# Patient Record
Sex: Female | Born: 1935 | Race: White | Hispanic: No | Smoking: Never smoker
Health system: Southern US, Community
[De-identification: ages and names within clinical notes are randomized; demographics above are authoritative.]

## PROBLEM LIST (undated history)

## (undated) DIAGNOSIS — F32A Depression, unspecified: Secondary | ICD-10-CM

## (undated) DIAGNOSIS — J45909 Unspecified asthma, uncomplicated: Secondary | ICD-10-CM

## (undated) DIAGNOSIS — C801 Malignant (primary) neoplasm, unspecified: Secondary | ICD-10-CM

## (undated) DIAGNOSIS — I10 Essential (primary) hypertension: Secondary | ICD-10-CM

## (undated) DIAGNOSIS — B019 Varicella without complication: Secondary | ICD-10-CM

## (undated) DIAGNOSIS — N390 Urinary tract infection, site not specified: Secondary | ICD-10-CM

## (undated) DIAGNOSIS — G459 Transient cerebral ischemic attack, unspecified: Secondary | ICD-10-CM

## (undated) DIAGNOSIS — F329 Major depressive disorder, single episode, unspecified: Secondary | ICD-10-CM

## (undated) DIAGNOSIS — I4891 Unspecified atrial fibrillation: Secondary | ICD-10-CM

## (undated) DIAGNOSIS — I639 Cerebral infarction, unspecified: Secondary | ICD-10-CM

## (undated) DIAGNOSIS — E039 Hypothyroidism, unspecified: Secondary | ICD-10-CM

## (undated) DIAGNOSIS — F0390 Unspecified dementia without behavioral disturbance: Secondary | ICD-10-CM

## (undated) DIAGNOSIS — I495 Sick sinus syndrome: Secondary | ICD-10-CM

## (undated) DIAGNOSIS — R32 Unspecified urinary incontinence: Secondary | ICD-10-CM

## (undated) HISTORY — DX: Urinary tract infection, site not specified: N39.0

## (undated) HISTORY — DX: Unspecified dementia, unspecified severity, without behavioral disturbance, psychotic disturbance, mood disturbance, and anxiety: F03.90

## (undated) HISTORY — PX: INSERT / REPLACE / REMOVE PACEMAKER: SUR710

## (undated) HISTORY — DX: Malignant (primary) neoplasm, unspecified: C80.1

## (undated) HISTORY — PX: PACEMAKER PLACEMENT: SHX43

## (undated) HISTORY — PX: COLOSTOMY: SHX63

## (undated) HISTORY — DX: Essential (primary) hypertension: I10

## (undated) HISTORY — DX: Unspecified asthma, uncomplicated: J45.909

## (undated) HISTORY — DX: Varicella without complication: B01.9

## (undated) HISTORY — DX: Hypothyroidism, unspecified: E03.9

## (undated) HISTORY — DX: Major depressive disorder, single episode, unspecified: F32.9

## (undated) HISTORY — PX: HIP FRACTURE SURGERY: SHX118

## (undated) HISTORY — PX: TOTAL HIP ARTHROPLASTY: SHX124

## (undated) HISTORY — DX: Depression, unspecified: F32.A

## (undated) HISTORY — PX: TOTAL SHOULDER REPLACEMENT: SUR1217

## (undated) HISTORY — DX: Unspecified atrial fibrillation: I48.91

## (undated) HISTORY — DX: Unspecified urinary incontinence: R32

## (undated) HISTORY — DX: Sick sinus syndrome: I49.5

## (undated) HISTORY — PX: ABDOMINAL HYSTERECTOMY: SHX81

## (undated) HISTORY — PX: WRIST SURGERY: SHX841

## (undated) HISTORY — DX: Transient cerebral ischemic attack, unspecified: G45.9

## (undated) HISTORY — DX: Cerebral infarction, unspecified: I63.9

## (undated) HISTORY — PX: THYROIDECTOMY: SHX17

---

## 2014-01-20 LAB — HEPATIC FUNCTION PANEL
ALK PHOS: 153 U/L — AB (ref 25–125)
ALT: 12 U/L (ref 7–35)
AST: 14 U/L (ref 13–35)
Bilirubin, Total: 0.5 mg/dL

## 2014-01-20 LAB — CBC AND DIFFERENTIAL
HEMATOCRIT: 33 % — AB (ref 36–46)
Hemoglobin: 10.7 g/dL — AB (ref 12.0–16.0)
PLATELETS: 231 10*3/uL (ref 150–399)
WBC: 6.7 10^3/mL

## 2014-01-20 LAB — BASIC METABOLIC PANEL
BUN: 19 mg/dL (ref 4–21)
CREATININE: 1.2 mg/dL — AB (ref 0.5–1.1)
Glucose: 114 mg/dL
Potassium: 3.6 mmol/L (ref 3.4–5.3)
Sodium: 141 mmol/L (ref 137–147)

## 2014-01-20 LAB — POCT INR: INR: 1 (ref 0.9–1.1)

## 2016-03-13 ENCOUNTER — Encounter: Payer: Self-pay | Admitting: Family Medicine

## 2016-03-13 ENCOUNTER — Ambulatory Visit (INDEPENDENT_AMBULATORY_CARE_PROVIDER_SITE_OTHER): Payer: Medicare Other | Admitting: Family Medicine

## 2016-03-13 VITALS — BP 122/60 | HR 80 | Temp 97.7°F | Wt 203.2 lb

## 2016-03-13 DIAGNOSIS — R296 Repeated falls: Secondary | ICD-10-CM | POA: Diagnosis not present

## 2016-03-13 DIAGNOSIS — L89152 Pressure ulcer of sacral region, stage 2: Secondary | ICD-10-CM

## 2016-03-13 DIAGNOSIS — F329 Major depressive disorder, single episode, unspecified: Secondary | ICD-10-CM

## 2016-03-13 DIAGNOSIS — F919 Conduct disorder, unspecified: Secondary | ICD-10-CM

## 2016-03-13 DIAGNOSIS — F32A Depression, unspecified: Secondary | ICD-10-CM

## 2016-03-13 DIAGNOSIS — I495 Sick sinus syndrome: Secondary | ICD-10-CM

## 2016-03-13 NOTE — Patient Instructions (Signed)
Take the meds as we discussed.  You may increase the Risperdal to 1 mg if you like.  We will arrange for home health.  Follow up in 3 months.  Take care  Dr. Lacinda Axon

## 2016-03-13 NOTE — Progress Notes (Signed)
Pre visit review using our clinic review tool, if applicable. No additional management support is needed unless otherwise documented below in the visit note. 

## 2016-03-14 ENCOUNTER — Encounter: Payer: Self-pay | Admitting: Family Medicine

## 2016-03-14 DIAGNOSIS — F329 Major depressive disorder, single episode, unspecified: Secondary | ICD-10-CM | POA: Insufficient documentation

## 2016-03-14 DIAGNOSIS — R296 Repeated falls: Secondary | ICD-10-CM | POA: Insufficient documentation

## 2016-03-14 DIAGNOSIS — L89159 Pressure ulcer of sacral region, unspecified stage: Secondary | ICD-10-CM | POA: Insufficient documentation

## 2016-03-14 DIAGNOSIS — F32A Depression, unspecified: Secondary | ICD-10-CM | POA: Insufficient documentation

## 2016-03-14 DIAGNOSIS — I495 Sick sinus syndrome: Secondary | ICD-10-CM | POA: Insufficient documentation

## 2016-03-14 DIAGNOSIS — F919 Conduct disorder, unspecified: Secondary | ICD-10-CM | POA: Insufficient documentation

## 2016-03-14 LAB — BASIC METABOLIC PANEL
BUN: 21 mg/dL (ref 6–23)
CHLORIDE: 105 meq/L (ref 96–112)
CO2: 29 mEq/L (ref 19–32)
Calcium: 9.1 mg/dL (ref 8.4–10.5)
Creatinine, Ser: 0.92 mg/dL (ref 0.40–1.20)
GFR: 61.35 mL/min (ref >60.00)
Glucose, Bld: 104 mg/dL — ABNORMAL HIGH (ref 70–99)
POTASSIUM: 4.2 meq/L (ref 3.5–5.1)
SODIUM: 142 meq/L (ref 135–145)

## 2016-03-14 NOTE — Progress Notes (Signed)
Subjective:  Patient ID: Carly Clay, female    DOB: May 06, 1927  Age: 82 y.o. MRN: [id number]  CC: Establish care  HPI Carly Clay is a 82 y.o. female presents to the clinic today to establish care. Current issues are below.  Sacral decubitus  Patient now lives with her daughter, Carly Clay.  Daughter is an Therapist, sports and has been tending to the wound.  She states it is shallow (depth - tip of Q-tip).  No drainage.   No erythema.  Daughter tries to offset pressure on the wound and have her void/changed regularly.  Frequent falls  Patient has history of frequent recurrent falls.  Secondary to weakness/deconditioning and multiple orthopedic issues.  Daughter is currently taking care of her and is doing okay with transfers.  She recently left assisted-living, and daughter would like to arrange for home health at home.  Sick sinus syndrome  S/p pacemaker placement.  Needs cardiologist for regular interrogation/check of device.  Nightmares  For the past several nights patient has been having vivid dreams and has been attempting to get out of bed at night.  She is currently on Risperdal. Daughter is unsure why, but we both think this is secondary to behavior issues while she was in the assisted-living facility.  Daughter would like to discuss if there is anything that can be done about this (med changes).   PMH, Surgical Hx, Family Hx, Social History reviewed and updated as below.  Past Medical History  Diagnosis Date  . Asthma   . Chicken pox   . Depression   . Cancer [facility])     Thyroid cancer, Cervical cancer  . Hypothyroidism   . Stroke [facility])     Following Thyroidectomy  . Urinary incontinence   . UTI (urinary tract infection)   . TIA (transient ischemic attack)   . Sick sinus syndrome [facility])    Past Surgical History  Procedure Laterality Date  . Total hip arthroplasty Left   . Hip fracture surgery Right   . Abdominal hysterectomy    . Wrist  surgery Left   . Pacemaker placement    . Colostomy      following Perforated bowel.  . Thyroidectomy    . Total shoulder replacement Left    Family History  Problem Relation Age of Onset  . Stroke Mother   . Hypertension Mother   . Stomach cancer Father    Social History  Substance Use Topics  . Smoking status: Never Smoker   . Smokeless tobacco: Never Used  . Alcohol Use: No    Review of Systems  Genitourinary:       Urinary incontinence.   Musculoskeletal: Positive for arthralgias.       Frequent falls.  Skin: Positive for wound.  All other systems reviewed and are negative.  Objective:   Today's Vitals: BP 122/60 mmHg  Pulse 80  Temp(Src) 97.7 F (36.5 C) (Oral)  Ht   Wt 203 lb 4 oz (92.194 kg)  SpO2 95%  Physical Exam  Constitutional:  Chronically ill appearing female, sitting in wheelchair, NAD.  HENT:  Head: Normocephalic and atraumatic.  Eyes: Conjunctivae are normal. No scleral icterus.  Neck: Neck supple.  Cardiovascular: Normal rate and regular rhythm.   Pulmonary/Chest: Effort normal. She has no wheezes. She has no rales.  Abdominal: Soft. She exhibits no distension. There is no tenderness. There is no rebound and no guarding.  Colostomy in place. No surrounding erythema.   Musculoskeletal:  Decreased ROM  of the right shoulder.   Neurological: She is alert.  Skin:  ~ 2 cm stage 2 sacral decubitus ulcer. No drainage.   Psychiatric:  Flat affect.    Assessment & Plan:   Problem List Items Addressed This Visit    Sacral decubitus ulcer - Primary    New problem. No evidence of infection. Arranging for home health RN to tend to and dress wound.      Relevant Orders   Ambulatory referral to [place] Metabolic Panel (BMET) (Completed)   Frequent falls    Arranging for home health PT, aide.      Sick sinus syndrome [facility])    Referring to cardiology for regular device checks.      Relevant Medications   furosemide (LASIX) 20  MG tablet   aspirin EC 81 MG tablet   Behavior disturbance    Will continue Risperdal. Advised that daughter can increase to 1 mg.      Depression    Continue Zoloft. Stopped Remeron today.      Relevant Medications   sertraline (ZOLOFT) 50 MG tablet   LORazepam (ATIVAN) 0.5 MG tablet      Outpatient Encounter Prescriptions as of 03/13/2016  Medication Sig  . aspirin EC 81 MG tablet Take 81 mg by mouth daily.  . Cholecalciferol (D3 ADULT PO) Take 2,000 Units by mouth.  . Cranberry-Vitamin C-Vitamin E (CRANBERRY PLUS VITAMIN C) 4200-20-3 MG-MG-UNIT CAPS Take 4,200 mg by mouth daily.  Mariane Baumgarten Sodium (COLACE PO) Take by mouth.  . furosemide (LASIX) 20 MG tablet Take 20 mg by mouth daily.  Marland Kitchen levothyroxine (SYNTHROID, LEVOTHROID) 150 MCG tablet Take 150 mcg by mouth daily before breakfast.  . LORazepam (ATIVAN) 0.5 MG tablet Take 0.5 mg by mouth as needed for anxiety.  . Multiple Vitamin (MULTI VITAMIN PO) Take by mouth.  . nitrofurantoin (MACRODANTIN) 50 MG capsule Take 50 mg by mouth at bedtime.  . risperiDONE (RISPERDAL) 0.5 MG tablet Take 0.5 mg by mouth at bedtime.  . sertraline (ZOLOFT) 50 MG tablet Take 50 mg by mouth daily.  . [DISCONTINUED] mirtazapine (REMERON) 15 MG tablet Take 15 mg by mouth at bedtime. Take one-half tablets at bedtime  . [DISCONTINUED] Specialty Vitamins Products (BIOTIN PLUS KERATIN) 10000-100 MCG-MG TABS Take 10,000 mcg by mouth daily.  . [DISCONTINUED] zinc sulfate 220 (50 Zn) MG capsule Take 220 mg by mouth daily.   No facility-administered encounter medications on file as of 03/13/2016.    Follow-up: 3 months.   [place]

## 2016-03-14 NOTE — Assessment & Plan Note (Signed)
Continue Zoloft. Stopped Remeron today.

## 2016-03-14 NOTE — Assessment & Plan Note (Addendum)
Arranging for home health PT, aide.

## 2016-03-14 NOTE — Assessment & Plan Note (Signed)
Will continue Risperdal. Advised that daughter can increase to 1 mg.

## 2016-03-14 NOTE — Assessment & Plan Note (Signed)
Referring to cardiology for regular device checks.

## 2016-03-14 NOTE — Assessment & Plan Note (Signed)
New problem. No evidence of infection. Arranging for home health RN to tend to and dress wound.

## 2016-03-16 ENCOUNTER — Telehealth: Payer: Self-pay | Admitting: Family Medicine

## 2016-03-16 NOTE — Telephone Encounter (Signed)
Call and see if a verbal can be given.

## 2016-03-16 NOTE — Telephone Encounter (Signed)
Faxed order to [facility].

## 2016-03-16 NOTE — Telephone Encounter (Signed)
Unable to reach sharon. Left message for a phone call back.

## 2016-03-16 NOTE — Telephone Encounter (Signed)
Can we not give a verbal.

## 2016-03-16 NOTE — Telephone Encounter (Signed)
Ivin Booty called from [facility] . She is needing an order for wound care to the patient stage 2 decubitus ulcer. She is expecting to see the patient this Friday you may call her on her cell phone or fax the order to [phone].

## 2016-03-16 NOTE — Telephone Encounter (Signed)
Please place orders

## 2016-03-16 NOTE — Telephone Encounter (Signed)
FYI

## 2016-03-29 ENCOUNTER — Encounter: Payer: Self-pay | Admitting: Family

## 2016-03-29 ENCOUNTER — Ambulatory Visit
Admission: RE | Admit: 2016-03-29 | Discharge: 2016-03-29 | Disposition: A | Discharge status: Home / Self Care | Payer: Medicare Other | Source: Ambulatory Visit | Attending: Family | Admitting: Family

## 2016-03-29 ENCOUNTER — Other Ambulatory Visit: Payer: Self-pay | Admitting: Family

## 2016-03-29 ENCOUNTER — Ambulatory Visit: Payer: Medicare Other | Admitting: Family Medicine

## 2016-03-29 ENCOUNTER — Ambulatory Visit (INDEPENDENT_AMBULATORY_CARE_PROVIDER_SITE_OTHER): Payer: Medicare Other | Admitting: Family

## 2016-03-29 VITALS — BP 122/70 | HR 80 | Temp 98.6°F | Resp 17 | Wt 154.0 lb

## 2016-03-29 DIAGNOSIS — R05 Cough: Secondary | ICD-10-CM | POA: Diagnosis present

## 2016-03-29 DIAGNOSIS — H109 Unspecified conjunctivitis: Secondary | ICD-10-CM | POA: Diagnosis not present

## 2016-03-29 DIAGNOSIS — I517 Cardiomegaly: Secondary | ICD-10-CM | POA: Insufficient documentation

## 2016-03-29 DIAGNOSIS — J690 Pneumonitis due to inhalation of food and vomit: Secondary | ICD-10-CM

## 2016-03-29 DIAGNOSIS — K449 Diaphragmatic hernia without obstruction or gangrene: Secondary | ICD-10-CM | POA: Insufficient documentation

## 2016-03-29 DIAGNOSIS — I700 Atherosclerosis of aorta: Secondary | ICD-10-CM | POA: Diagnosis not present

## 2016-03-29 DIAGNOSIS — Z950 Presence of cardiac pacemaker: Secondary | ICD-10-CM | POA: Insufficient documentation

## 2016-03-29 DIAGNOSIS — R059 Cough, unspecified: Secondary | ICD-10-CM

## 2016-03-29 MED ORDER — BENZONATATE 100 MG PO CAPS: 100.0000 mg | ORAL_CAPSULE | Freq: Three times a day (TID) | ORAL | Status: DC | PRN

## 2016-03-29 MED ORDER — ALBUTEROL SULFATE HFA 108 (90 BASE) MCG/ACT IN AERS: 2.0000 | INHALATION_SPRAY | Freq: Four times a day (QID) | RESPIRATORY_TRACT | Status: AC | PRN

## 2016-03-29 NOTE — Patient Instructions (Signed)
CXR   Use albuterol every 6 hours for first 24 hours to get good medication into the lungs and loosen congestion; after, you may use as needed and eventually stop all together when cough resolves.  Increase intake of clear fluids. Congestion is best treated by hydration, when mucus is wetter, it is thinner, less sticky, and easier to expel from the body, either through coughing up drainage, or by blowing your nose.   Get plenty of rest.   Use saline nasal drops and blow your nose frequently. Run a humidifier at night and elevate the head of the bed. Vicks Vapor rub will help with congestion and cough. Steam showers and sinus massage for congestion.   Use Acetaminophen or Ibuprofen as needed for fever or pain. Avoid second hand smoke. Even the smallest exposure will worsen symptoms.   Over the counter medications you can try include Delsym for cough, a decongestant for congestion, and Mucinex or Robitussin as an expectorant. Be sure to just get the plain Mucinex or Robitussin that just has one medication (Guaifenesen). We don't recommend the combination products. Note, be sure to drink two glasses of water with each dose of Mucinex as the medication will not work well without adequate hydration.   You can also try a teaspoon of honey to see if this will help reduce cough. Throat lozenges can sometimes be beneficial as well.    This illness will typically last 7 - 10 days.   Please follow up with our clinic if you develop a fever greater than 101 F, symptoms worsen, or do not resolve in the next week.

## 2016-03-29 NOTE — Telephone Encounter (Signed)
I left message with patient to call back at the [place] office for results of imaging and discussion of antibiotic. I also told her a new medication was being sent to her pharmacy however I have pended it until we speak with her for safety.    Would you call her and ask how her eye appointment went ? I had concern she had orbital cellulitis in addition to PNA.    Is she on an ORAL antibiotic ? I want to be sure that there isnt an interaction between her medications.   If not interaction, let patient know I will send in doxycycline for PNA seen on CXR.

## 2016-03-29 NOTE — Progress Notes (Signed)
Pre-visit discussion using our clinic review tool. No additional management support is needed unless otherwise documented below in the visit note.  

## 2016-03-29 NOTE — Progress Notes (Signed)
Subjective:    Patient ID: Carly Clay, female    DOB: [birth year], 82 y.o.   MRN: [id number]   Bronwynn Kura Clay is a 82 y.o. female who presents today for an acute visit.    HPI Comments: Patient presents for wheezing, cough for past 2 days. Cough worse at night.  Accompanied by daughter who she lives with. She also complains of drainage and redness in left eye for one day. Matted shut this morning. Endorses swelling around the left eye. Has tried benadryl and tylenol last night with some relief. No fever, chills,   No changes in vision, sensitivity to light, or eye pain. She doesn't wear contacts.   Daughter notes PT has concern for Parkinson's due to changes in gait.   Past Medical History  Diagnosis Date  . Asthma   . Chicken pox   . Depression   . Cancer [facility])     Thyroid cancer, Cervical cancer  . Hypothyroidism   . Stroke [facility])     Following Thyroidectomy  . Urinary incontinence   . UTI (urinary tract infection)   . TIA (transient ischemic attack)   . Sick sinus syndrome (HCC)    Allergies: Penicillins Current Outpatient Prescriptions on File Prior to Visit  Medication Sig Dispense Refill  . aspirin EC 81 MG tablet Take 81 mg by mouth daily.    . Cholecalciferol (D3 ADULT PO) Take 2,000 Units by mouth.    . Cranberry-Vitamin C-Vitamin E (CRANBERRY PLUS VITAMIN C) 4200-20-3 MG-MG-UNIT CAPS Take 4,200 mg by mouth daily.    Mariane Baumgarten Sodium (COLACE PO) Take by mouth.    . furosemide (LASIX) 20 MG tablet Take 20 mg by mouth daily.    Marland Kitchen levothyroxine (SYNTHROID, LEVOTHROID) 150 MCG tablet Take 150 mcg by mouth daily before breakfast.    . LORazepam (ATIVAN) 0.5 MG tablet Take 0.5 mg by mouth as needed for anxiety.    . Multiple Vitamin (MULTI VITAMIN PO) Take by mouth.    . nitrofurantoin (MACRODANTIN) 50 MG capsule Take 50 mg by mouth at bedtime.    . risperiDONE (RISPERDAL) 0.5 MG tablet Take 0.5 mg by mouth at bedtime.    . sertraline (ZOLOFT) 50 MG tablet  Take 50 mg by mouth daily.     No current facility-administered medications on file prior to visit.    Social History  Substance Use Topics  . Smoking status: Never Smoker   . Smokeless tobacco: Never Used  . Alcohol Use: No    Review of Systems  Constitutional: Negative for fever and chills.  HENT: Negative for congestion, ear pain and sore throat.   Eyes: Positive for discharge and redness. Negative for photophobia, pain and visual disturbance.  Respiratory: Positive for cough and wheezing. Negative for shortness of breath.   Cardiovascular: Positive for leg swelling (chronic). Negative for chest pain and palpitations.  Gastrointestinal: Negative for nausea and vomiting.      Objective:    BP 122/70 mmHg  Pulse 80  Temp(Src) 98.6 F (37 C) (Oral)  Resp 17  Wt 154 lb (69.854 kg)  SpO2 95%   Physical Exam  Constitutional: She appears well-developed and well-nourished.  HENT:  Head: Normocephalic and atraumatic.  Right Ear: Hearing, tympanic membrane, external ear and ear canal normal. No drainage, swelling or tenderness. No foreign bodies. Tympanic membrane is not erythematous and not bulging. No middle ear effusion. No decreased hearing is noted.  Left Ear: Hearing, tympanic membrane, external ear and ear canal  normal. No drainage, swelling or tenderness. No foreign bodies. Tympanic membrane is not erythematous and not bulging.  No middle ear effusion. No decreased hearing is noted.  Nose: No rhinorrhea. Right sinus exhibits no maxillary sinus tenderness and no frontal sinus tenderness. Left sinus exhibits no maxillary sinus tenderness and no frontal sinus tenderness.  Mouth/Throat: Uvula is midline, oropharynx is clear and moist and mucous membranes are normal. No oropharyngeal exudate, posterior oropharyngeal edema, posterior oropharyngeal erythema or tonsillar abscesses.  Eyes: EOM are normal. Pupils are equal, round, and reactive to light. Lids are everted and swept, no  foreign bodies found. Right eye exhibits no discharge. Left eye exhibits discharge. Left eye exhibits no hordeolum. No foreign body present in the left eye. Right conjunctiva is not injected. Right conjunctiva has no hemorrhage. Left conjunctiva is injected. Left conjunctiva has no hemorrhage. No scleral icterus.  No external eye lesions.   Left eye:  Erythema, swelling surrounding left eye. No tenderness with palpation of suprorbital notch or maxilla.Diffuse injection of the conjunctiva. Purulent pus in eyelashes. No white spots, opacity, or foreign body appreciated. No collection of blood or pus in the anterior chamber. No ciliary flush surrounding iris.   No photophobia or eye pain appreciated during exam.   Cardiovascular: Regular rhythm, normal heart sounds and normal pulses.   Pulmonary/Chest: Effort normal and breath sounds normal. She has no wheezes. She has no rhonchi. She has no rales.  Lymphadenopathy:       Head (right side): No submental, no submandibular, no tonsillar, no preauricular, no posterior auricular and no occipital adenopathy present.       Head (left side): No submental, no submandibular, no tonsillar, no preauricular, no posterior auricular and no occipital adenopathy present.    She has no cervical adenopathy.  Neurological: She is alert.  Skin: Skin is warm and dry.  Psychiatric: She has a normal mood and affect. Her speech is normal and behavior is normal. Thought content normal.  Vitals reviewed.      Assessment & Plan:   1. Cough No acute respiratory distress. Vitals WNL. Pending CXR.   - benzonatate (TESSALON) 100 MG capsule; Take 1 capsule (100 mg total) by mouth 3 (three) times daily as needed for cough.  Dispense: 20 capsule; Refill: 1 - albuterol (PROVENTIL HFA) 108 (90 Base) MCG/ACT inhaler; Inhale 2 puffs into the lungs every 6 (six) hours as needed for wheezing or shortness of breath.  Dispense: 1 Inhaler; Refill: 1 - DG Chest 2 View  2.  Conjunctivitis of left eye Concern for swelling and erythema for early orbital cellulitis. Patient left directly from our office to see [facility] for prompt evaluation. Alternately bacterial conjunctivitis   Of note, offered referral to neurology for daughter's concern for Parkinson's. She politely declined and stated she would f/u with PCP in September.   I am having Ms. Myrtice Lauth maintain her levothyroxine, furosemide, aspirin EC, sertraline, Multiple Vitamin (MULTI VITAMIN PO), Cranberry-Vitamin C-Vitamin E, Cholecalciferol (D3 ADULT PO), Docusate Sodium (COLACE PO), LORazepam, nitrofurantoin, and risperiDONE.   No orders of the defined types were placed in this encounter.     Start medications as prescribed and explained to patient on After Visit Summary ( AVS). Risks, benefits, and alternatives of the medications and treatment plan prescribed today were discussed, and patient expressed understanding.   Education regarding symptom management and diagnosis given to patient.   Follow-up:Plan follow-up and return precautions given if any worsening symptoms or change in condition.   Continue to  follow with Coral Spikes, DO for routine health maintenance.   Aarushi Lurena Joiner and I agreed with plan.   Mable Paris, FNP

## 2016-03-30 MED ORDER — DOXYCYCLINE HYCLATE 100 MG PO TABS: 100.0000 mg | ORAL_TABLET | Freq: Two times a day (BID) | ORAL | Status: DC

## 2016-03-30 NOTE — Telephone Encounter (Signed)
Spoke with daughter about chest x-ray and prescription for doxycycline. She will f/u with PCP. She stated ophthalmologist diagnosed her mom with viral conjunctivitis ,not cellulitis.

## 2016-03-30 NOTE — Telephone Encounter (Signed)
Patient scheduled for that day at 1 p.m.

## 2016-04-04 ENCOUNTER — Encounter: Payer: Self-pay | Admitting: Family Medicine

## 2016-04-04 ENCOUNTER — Ambulatory Visit (INDEPENDENT_AMBULATORY_CARE_PROVIDER_SITE_OTHER): Payer: Medicare Other | Admitting: Family Medicine

## 2016-04-04 VITALS — BP 130/74 | HR 73 | Temp 98.2°F | Wt 210.2 lb

## 2016-04-04 DIAGNOSIS — R05 Cough: Secondary | ICD-10-CM

## 2016-04-04 DIAGNOSIS — H109 Unspecified conjunctivitis: Secondary | ICD-10-CM | POA: Diagnosis not present

## 2016-04-04 DIAGNOSIS — N390 Urinary tract infection, site not specified: Secondary | ICD-10-CM | POA: Insufficient documentation

## 2016-04-04 DIAGNOSIS — E039 Hypothyroidism, unspecified: Secondary | ICD-10-CM | POA: Insufficient documentation

## 2016-04-04 DIAGNOSIS — R059 Cough, unspecified: Secondary | ICD-10-CM

## 2016-04-04 NOTE — Progress Notes (Signed)
   Subjective:  Patient ID: Carly Clay, female    DOB: 25-Jun-1927  Age: 82 y.o. MRN: [id number]  CC: Follow up from recent infection  HPI:  82 year old female with SSS, hypothyroidism, recurrent UTI, depression, sacral decub presents for follow up.  Patient recently seen on 7/5. She was diagnosed with conjunctivitis and was also treated empirically for pneumonia. She has since followed up with ophthalmology and was told she had viral conjunctivitis.  Daughter states she is doing well at this time. Cough is essentially resolved and her conjunctivitis is improving. Patient has not current complaints at this time.  Daughter does report that the physical therapist is working with her is concerned that she has some features of Parkinson's given her gait issues. She would like to discuss this today.  Social Hx   Social History   Social History  . Marital Status: Divorced    Spouse Name: N/A  . Number of Children: N/A  . Years of Education: N/A   Social History Main Topics  . Smoking status: Never Smoker   . Smokeless tobacco: Never Used  . Alcohol Use: No  . Drug Use: No  . Sexual Activity: Not Asked   Other Topics Concern  . None   Social History Narrative   Review of Systems  Eyes: Positive for redness.  Respiratory: Positive for cough.    Objective:  BP 130/74 mmHg  Pulse 73  Temp(Src) 98.2 F (36.8 C) (Oral)  Wt 210 lb 4 oz (95.369 kg)  SpO2 96%  BP/Weight 04/04/2016 03/29/2016 [id number]  Systolic BP [id number]  Diastolic BP 74 70 60  Wt. (Lbs) 210.25 154 203.25   Physical Exam  Constitutional: She appears well-developed. No distress.  Eyes:  Mild redness of L conjunctiva.  Cardiovascular: Regular rhythm.   Tachycardia noted then returned to normal rate.  Pulmonary/Chest: Effort normal and breath sounds normal.  Abdominal: Soft. She exhibits no distension. There is no tenderness.  Psychiatric:  Flat affect.  Vitals reviewed.  Lab Results  Component  Value Date   GLUCOSE 104* 03/13/2016   NA 142 03/13/2016   K 4.2 03/13/2016   CL 105 03/13/2016   CREATININE 0.92 03/13/2016   BUN 21 03/13/2016   CO2 29 03/13/2016   Assessment & Plan:   Problem List Items Addressed This Visit    Cough - Primary    Established problem, improving. Continue PRN Tessalon.       Conjunctivitis    Established problem, improving. Advised supportive care.         Follow-up: Has follow up scheduled  [place]

## 2016-04-04 NOTE — Progress Notes (Signed)
Pre visit review using our clinic review tool, if applicable. No additional management support is needed unless otherwise documented below in the visit note. 

## 2016-04-04 NOTE — Assessment & Plan Note (Signed)
Established problem, improving. Continue PRN Tessalon.

## 2016-04-04 NOTE — Patient Instructions (Signed)
Let me know if you need anything.  Follow up in Sept.  Take care  Dr. Lacinda Axon

## 2016-04-04 NOTE — Assessment & Plan Note (Signed)
Established problem, improving. Advised supportive care.

## 2016-04-10 ENCOUNTER — Telehealth: Payer: Self-pay | Admitting: *Deleted

## 2016-04-10 NOTE — Telephone Encounter (Signed)
Left a VM to return my call for orders. thanks

## 2016-04-10 NOTE — Telephone Encounter (Signed)
Left a VM for her to return my call, Please advise if there is any change in the wound care original orders, I don't see anything in the last visit note. thanks

## 2016-04-10 NOTE — Telephone Encounter (Signed)
You okay with the iodoform too? thanks

## 2016-04-10 NOTE — Telephone Encounter (Signed)
Increase visits fine by me. Give verbal please.

## 2016-04-10 NOTE — Telephone Encounter (Signed)
Spoke with Carly Clay, she is concerned as one week ago she had decreased visits for her sacral ulcer as it was essential healed, she had the family using barrier cream for one week and then followed up on Friday.  They were using a drsg only as protection, but daughters found that there was an increase in drainage on Thursday and when Carly Clay went out on Friday there is now a pin hole opening again.  Due to size only thing that she suggested was maybe 1/4 inch iodoform and turn sideways and roll small to put in the hole, or do you think it needs to be debrided again to open and have it heal from the inside out.  If so she would like to increase the frequency of her visits to a min. Of 2 times a week and max of 3 times a week.  Family doesn't want her to go to more doctors.    Thanks

## 2016-04-10 NOTE — Telephone Encounter (Signed)
[name] from [facility] has requested a call in reference to pt wound care. [name] [phone] Ext [phone]

## 2016-04-10 NOTE — Telephone Encounter (Signed)
Yes

## 2016-04-11 ENCOUNTER — Telehealth: Payer: Self-pay | Admitting: Family Medicine

## 2016-04-11 NOTE — Telephone Encounter (Signed)
Please advise, thanks.

## 2016-04-11 NOTE — Telephone Encounter (Signed)
Carly Clay [id number] called from [facility] regarding pt has a rash under wafer in abdominal fold has a white drainage and it smells like yeast. She wants to know if some Nystatin powder can be prescribed?  Pharmacy is  [facility], [place]  Thank you!

## 2016-04-11 NOTE — Telephone Encounter (Signed)
Spoke with [name], gave orders for iodoform for packing and increased visits to either 2 or 3 times a week. [name] was going to call the family to see what would work best for them.  Orders will be sent over, thanks

## 2016-04-12 ENCOUNTER — Other Ambulatory Visit: Payer: Self-pay | Admitting: Family Medicine

## 2016-04-12 MED ORDER — NYSTATIN 100000 UNIT/GM EX POWD: Freq: Three times a day (TID) | CUTANEOUS | Status: AC

## 2016-04-12 NOTE — Telephone Encounter (Signed)
Rx sent 

## 2016-04-12 NOTE — Telephone Encounter (Signed)
Noted thanks °

## 2016-04-18 ENCOUNTER — Ambulatory Visit: Payer: Medicare Other | Admitting: Cardiovascular Disease

## 2016-05-10 ENCOUNTER — Telehealth: Payer: Self-pay | Admitting: Family

## 2016-05-10 NOTE — Telephone Encounter (Signed)
Please advise 

## 2016-05-10 NOTE — Telephone Encounter (Signed)
Garfield Cornea called from [facility] saying they need orders for a Face to Face Encounter for Carly Clay from June 23rd, 2017. Please give her a phone call regarding this.  [name] ph# [phone] Thank you.

## 2016-05-10 NOTE — Telephone Encounter (Signed)
She will be called and will be faxing over corrections to orders.

## 2016-05-11 ENCOUNTER — Ambulatory Visit (INDEPENDENT_AMBULATORY_CARE_PROVIDER_SITE_OTHER): Payer: Medicare Other | Admitting: Internal Medicine

## 2016-05-11 ENCOUNTER — Encounter: Payer: Self-pay | Admitting: Internal Medicine

## 2016-05-11 VITALS — BP 140/84 | HR 67 | Ht 64.0 in | Wt 154.0 lb

## 2016-05-11 DIAGNOSIS — I4891 Unspecified atrial fibrillation: Secondary | ICD-10-CM | POA: Diagnosis not present

## 2016-05-11 DIAGNOSIS — I495 Sick sinus syndrome: Secondary | ICD-10-CM

## 2016-05-11 NOTE — Patient Instructions (Addendum)
Medication Instructions: - Your physician has recommended you make the following change in your medication:  1) Stop aspirin  Labwork: - none  Procedures/Testing: - none  Follow-Up: - Remote monitoring is used to monitor your Pacemaker of ICD from home. This monitoring reduces the number of office visits required to check your device to one time per year. It allows [place] to keep an eye on the functioning of your device to ensure it is working properly. You are scheduled for a device check from home on 08/10/16. You may send your transmission at any time that day. If you have a wireless device, the transmission will be sent automatically. After your physician reviews your transmission, you will receive a postcard with your next transmission date.  - Your physician wants you to follow-up in: 1 year with Dr. Caryl Comes. You will receive a reminder letter in the mail two months in advance. If you don't receive a letter, please call our office to schedule the follow-up appointment.  Any Additional Special Instructions Will Be Listed Below (If Applicable). - Please obtain an abdominal binder to be worn during the day - 6 " blocks under the head of your bed   If you need a refill on your cardiac medications before your next appointment, please call your pharmacy.

## 2016-05-11 NOTE — Progress Notes (Signed)
ELECTROPHYSIOLOGY CONSULT NOTE  Patient ID: Carly Clay, MRN: [id number], DOB/AGE: 1926-09-29 82 y.o. Admit date: (Not on file) Date of Consult: 05/11/2016  Primary Physician: Coral Spikes, DO Primary Cardiologist: [facility] Physician  N/A  Chief Complaint: to establish   HPI Carly Clay is a 82 y.o. female  Seen to establish cardiac follow-up. She has a history of a pacemaker implanted remotely 1995 with 2 interval procedures, 1 in 2005 which likely included generator replacement as well as ventricular lead insertion and generator replacement 2013. The daughter (Mrs. Everett--RN--specials [facility]) reports that sick sinus syndrome with the indication for pacing outside notes however referred for symptomatic bifascicular block..    She carries a diagnosis of atrial fibrillation. She has a history of a prior TIA and a CHADS-VASc score of greater than or equal to 22 (age, gender, hypertension). She has not been treated with anticoagulation however because of recurrent falls. She is on aspirin.  She has a history of orthostatic intolerance.  She has no history of heart failure. Do not have data related to LV function butfrom the outside physician referred to mitral and tricuspid regurgitation.   Past Medical History:  Diagnosis Date  . A-fib ([place])   . Asthma   . Cancer [facility])    Thyroid cancer, Cervical cancer  . Chicken pox   . Depression   . Hypothyroidism   . Sick sinus syndrome ([place])   . Stroke [facility])    Following Thyroidectomy  . TIA (transient ischemic attack)   . Urinary incontinence   . UTI (urinary tract infection)       Surgical History:  Past Surgical History:  Procedure Laterality Date  . ABDOMINAL HYSTERECTOMY    . COLOSTOMY     following Perforated bowel.  Marland Kitchen HIP FRACTURE SURGERY Right   . INSERT / REPLACE / REMOVE PACEMAKER    . PACEMAKER PLACEMENT    . THYROIDECTOMY    . TOTAL HIP ARTHROPLASTY Left   . TOTAL SHOULDER  REPLACEMENT Left   . WRIST SURGERY Left      Home Meds: Prior to Admission medications   Medication Sig Start Date End Date Taking? Authorizing Provider  albuterol (PROVENTIL HFA) 108 (90 Base) MCG/ACT inhaler Inhale 2 puffs into the lungs every 6 (six) hours as needed for wheezing or shortness of breath. 03/29/16  Yes Burnard Hawthorne, FNP  aspirin EC 81 MG tablet Take 81 mg by mouth daily.   Yes Historical Provider, MD  benzonatate (TESSALON) 100 MG capsule Take 1 capsule (100 mg total) by mouth 3 (three) times daily as needed for cough. 03/29/16  Yes Burnard Hawthorne, FNP  Cholecalciferol (D3 ADULT PO) Take 2,000 Units by mouth.   Yes Historical Provider, MD  Cranberry-Vitamin C-Vitamin E (CRANBERRY PLUS VITAMIN C) 4200-20-3 MG-MG-UNIT CAPS Take 4,200 mg by mouth daily.   Yes Historical Provider, MD  Docusate Sodium (COLACE PO) Take 100 mg by mouth 2 (two) times daily.    Yes Historical Provider, MD  levothyroxine (SYNTHROID, LEVOTHROID) 150 MCG tablet Take 150 mcg by mouth daily before breakfast.   Yes Historical Provider, MD  LORazepam (ATIVAN) 0.5 MG tablet Take 0.5 mg by mouth as needed for anxiety.   Yes Historical Provider, MD  Multiple Vitamin (MULTI VITAMIN PO) Take by mouth.   Yes Historical Provider, MD  nitrofurantoin (MACRODANTIN) 50 MG capsule Take 50 mg by mouth at bedtime.   Yes Historical Provider, MD  nystatin (MYCOSTATIN/NYSTOP) powder Apply  topically 3 (three) times daily. 04/12/16  Yes Coral Spikes, DO  risperiDONE (RISPERDAL) 0.5 MG tablet Take 0.5 mg by mouth at bedtime.   Yes Historical Provider, MD  sertraline (ZOLOFT) 50 MG tablet Take 50 mg by mouth daily.   Yes Historical Provider, MD    Allergies:  Allergies  Allergen Reactions  . Penicillins     Social History   Social History  . Marital status: Divorced    Spouse name: N/A  . Number of children: N/A  . Years of education: N/A   Occupational History  . Not on file.   Social History Main Topics  .  Smoking status: Never Smoker  . Smokeless tobacco: Never Used  . Alcohol use No  . Drug use: No  . Sexual activity: Not on file   Other Topics Concern  . Not on file   Social History Narrative  . No narrative on file     Family History  Problem Relation Age of Onset  . Stroke Mother   . Hypertension Mother   . Stomach cancer Father      ROS:  Please see the history of present illness.     All other systems reviewed and negative.    Physical Exam: Blood pressure 140/84, pulse 67, height 5\' 4"  (1.626 m), weight 154 lb (69.9 kg). General: Well developed, well nourished female in no acute distress. sitting in a wheelchair.  Head: Normocephalic, atraumatic, sclera non-icteric, no xanthomas, nares are without discharge. EENT: normal  Lymph Nodes:  none Neck: Negative for carotid bruits. JVD not elevated. Back:without scoliosis kyphosis Device pocket well healed; without hematoma or erythema.  There is no tethering  Lungs: Clear bilaterally to auscultation without wheezes, rales, or rhonchi. Breathing is unlabored. Heart: RRR with S1 S2.  2/6 systolic  murmur . No rubs, or gallops appreciated. Abdomen: Soft, non-tender, non-distended with normoactive bowel sounds. No hepatomegaly. No rebound/guarding. No obvious abdominal masses. Msk:  Strength and tone appear normal for age. Extremities: No clubbing or cyanosis. 1+ edema.  Distal pedal pulses are 2+ and equal bilaterally. Skin: Warm and Dry Neuro: Alert and oriented X 3. CN III-XII intact Grossly normal sensory and motor function . Sitting in a wheelchair  Psych:  Responds to questions appropriately with a flat affect.      Labs: Cardiac Enzymes No results for input(s): CKTOTAL, CKMB, TROPONINI in the last 72 hours. CBC No results found for: WBC, HGB, HCT, MCV, PLT PROTIME: No results for input(s): LABPROT, INR in the last 72 hours. Chemistry No results for input(s): NA, K, CL, CO2, BUN, CREATININE, CALCIUM, PROT, BILITOT,  ALKPHOS, ALT, AST, GLUCOSE in the last 168 hours.  Invalid input(s): LABALBU Lipids No results found for: CHOL, HDL, LDLCALC, TRIG BNP No results found for: PROBNP Thyroid Function Tests: No results for input(s): TSH, T4TOTAL, T3FREE, THYROIDAB in the last 72 hours.  Invalid input(s): FREET3 Miscellaneous No results found for: DDIMER  Radiology/Studies:  No results found.  EKG: Sinus rhythm at 67 Intervals 20/14/44 Axis left -58 Right bundle branch block/IVCD   poor R wave progression consistent with anterolateral MI. Notably this ECG was done with her to chair     Assessment and Plan:  Bifascicular block  Pacemaker-[employer]onic  Atrial lead warning-bipolar lead impedance/atrial oversensing  Atrial fibrillation detections--available electrograms suggest oversensing  Orthostatic intolerance  Edema-chronic  TIA  Hypertension  Device function was notable for atrial bipolar impedance were removed, post mode switch atrial pacing constituting 30% of her  total time with atrial episodes mostly over sensing. This intervention was programmed off.  We discussed anticoagulation and antiplatelet therapy. Based on data from [place] demonstrating higher mortality with aspirin compared to placebo in every subgroup of patients with atrial fibrillation regardless of CHADS-VASc score as well as the European Guidelines, I recommended that she stop her aspirin. I recommended the use of apixoban based on the [place] data, but her daughter is disinclined because of her history of falls and "impulsive behavior" to utilize anticoagulation.  Nonpharmacological therapies offered for her orthostatic intolerance to reduce the risk of falls included an abdominal binder and raising the head of her bed 6 inches.  Blood pressure control at this juncture it is reasonable and we will establish remote follow-up for her pacemaker  I suggested the use of an abdominal binder as well as raising the head of  the bed 6 inches     Virl Axe

## 2016-05-19 LAB — CUP PACEART INCLINIC DEVICE CHECK
Implantable Lead Implant Date: 20130708
Implantable Lead Location: 753859
MDC IDC LEAD IMPLANT DT: 20130708
MDC IDC LEAD LOCATION: 753860
MDC IDC SESS DTM: 20170825114753

## 2016-05-31 ENCOUNTER — Encounter: Payer: Self-pay | Admitting: Family Medicine

## 2016-06-07 ENCOUNTER — Telehealth: Payer: Self-pay | Admitting: *Deleted

## 2016-06-07 NOTE — Telephone Encounter (Signed)
Spoke with patient's daughter regarding home monitor setup.  She states that she is not at home near the monitor right now for assistance, but that she has the monitor.  Advised that our office will call to remind her when a transmission is due (next one scheduled for 08/10/16).  Patient's daughter is agreeable to this plan and is appreciative of call.

## 2016-06-13 ENCOUNTER — Ambulatory Visit (INDEPENDENT_AMBULATORY_CARE_PROVIDER_SITE_OTHER): Payer: Medicare Other | Admitting: Family Medicine

## 2016-06-13 ENCOUNTER — Encounter: Payer: Self-pay | Admitting: Family Medicine

## 2016-06-13 ENCOUNTER — Encounter (INDEPENDENT_AMBULATORY_CARE_PROVIDER_SITE_OTHER): Payer: Self-pay

## 2016-06-13 VITALS — BP 154/76 | HR 78 | Wt 174.0 lb

## 2016-06-13 DIAGNOSIS — N390 Urinary tract infection, site not specified: Secondary | ICD-10-CM

## 2016-06-13 DIAGNOSIS — L89152 Pressure ulcer of sacral region, stage 2: Secondary | ICD-10-CM

## 2016-06-13 DIAGNOSIS — E039 Hypothyroidism, unspecified: Secondary | ICD-10-CM

## 2016-06-13 DIAGNOSIS — F919 Conduct disorder, unspecified: Secondary | ICD-10-CM

## 2016-06-13 DIAGNOSIS — R739 Hyperglycemia, unspecified: Secondary | ICD-10-CM

## 2016-06-13 DIAGNOSIS — D649 Anemia, unspecified: Secondary | ICD-10-CM

## 2016-06-13 NOTE — Progress Notes (Signed)
Subjective:  Patient ID: Carly Clay, female    DOB: July 13, 1927  Age: 82 y.o. MRN: [id number]  CC: Follow up  HPI:  82 year old female with SSS, Sacral Decubitus ulcer, recurrent UTI, hypothyroidism presents for follow-up.  Sacral decubitus  History foreign addressed by home health RN and daughter (who is an Therapist, sports).  Daughter reports recent change in drainage color.  This is slightly concerning.  She would like this examined closely today.  Behavior disturbance/issues  Hallucinations particularly at night.  Has been stable and managed with conservative measures at home and Risperdal.  Hypothyroidism  In need of TSH.  Compliant with Synthroid 150 mcg.  Recurrent UTI  No recent UTI.  Daughter would like to discuss discontinuation of prophylactic nitrofurantoin.  Social Hx   Social History   Social History  . Marital status: Divorced    Spouse name: N/A  . Number of children: N/A  . Years of education: N/A   Social History Main Topics  . Smoking status: Never Smoker  . Smokeless tobacco: Never Used  . Alcohol use No  . Drug use: No  . Sexual activity: Not Asked   Other Topics Concern  . None   Social History Narrative  . None   Review of Systems  Constitutional: Negative.   Skin: Positive for wound.  Psychiatric/Behavioral: Positive for behavioral problems and confusion.   Objective:  BP (!) 154/76 (BP Location: Left Arm, Patient Position: Sitting, Cuff Size: Large)   Pulse 78   Wt 174 lb (78.9 kg)   SpO2 96%   BMI 29.87 kg/m   BP/Weight 06/13/2016 05/11/2016 [id number]  Systolic BP [id number] [id number]  Diastolic BP 76 84 74  Wt. (Lbs) 174 154 210.25  BMI 29.87 26.43 -    Physical Exam  Constitutional:  Chronically ill appearing female; NAD.  Cardiovascular: Normal rate and regular rhythm.   Pulmonary/Chest: Effort normal. She has no wheezes. She has no rales.  Neurological: She is alert.  Skin:  Small open sacral decubitus with no  surrounding erythema. No drainage noted.   Psychiatric:  Flat affect.  Vitals reviewed.  Lab Results  Component Value Date   WBC 6.7 01/20/2014   HGB 10.7 (A) 01/20/2014   HCT 33 (A) 01/20/2014   PLT 231 01/20/2014   GLUCOSE 104 (H) 03/13/2016   ALT 12 01/20/2014   AST 14 01/20/2014   NA 142 03/13/2016   K 4.2 03/13/2016   CL 105 03/13/2016   CREATININE 0.92 03/13/2016   BUN 21 03/13/2016   CO2 29 03/13/2016   INR 1.0 01/20/2014    Assessment & Plan:   Problem List Items Addressed This Visit    Behavior disturbance    Stable at this time. Continue Risperdal.       Hypothyroidism - Primary    TSH today. Continue current dose of Synthroid while awaiting TSH.      Relevant Orders   TSH   Recurrent UTI    Stable. No recent UTIs. After discussion, patient and daughter were okay with cessation of nitrofurantoin. Will discontinue.      Sacral decubitus ulcer    Stable at this time. Continue dressings/care provided by home health RN.       Other Visit Diagnoses    Anemia, unspecified anemia type       Relevant Orders   Iron Binding Cap (TIBC)   Ferritin   CBC   Blood glucose elevated       Relevant Orders  HgB A1c     Follow-up: 3-6 months.  [place]

## 2016-06-13 NOTE — Assessment & Plan Note (Signed)
Stable at this time. Continue dressings/care provided by home health RN.

## 2016-06-13 NOTE — Assessment & Plan Note (Signed)
TSH today. Continue current dose of Synthroid while awaiting TSH.

## 2016-06-13 NOTE — Assessment & Plan Note (Signed)
Stable at this time. Continue Risperdal.

## 2016-06-13 NOTE — Patient Instructions (Signed)
Shes doing well.  If home health needs anything, let me know.  Follow up in 3-6 months.  Take care  Dr. Lacinda Axon

## 2016-06-13 NOTE — Assessment & Plan Note (Signed)
Stable. No recent UTIs. After discussion, patient and daughter were okay with cessation of nitrofurantoin. Will discontinue.

## 2016-07-03 ENCOUNTER — Telehealth: Payer: Self-pay | Admitting: Family Medicine

## 2016-07-03 NOTE — Telephone Encounter (Signed)
Do you want her to be seen in the office?

## 2016-07-03 NOTE — Telephone Encounter (Signed)
Malachy Mood from [facility] care called and stated that granddaughter thinks that pt has a uti she has been having some hallucinations and cloudy urine. Can we send over script for urine culture. Please advise, thank you!  Call [phone] ext [phone]

## 2016-07-03 NOTE — Telephone Encounter (Signed)
Written rx was sent to [facility].  Fax number [phone]

## 2016-07-03 NOTE — Telephone Encounter (Signed)
Okay to order UA and culture

## 2016-07-12 ENCOUNTER — Telehealth: Payer: Self-pay | Admitting: Family Medicine

## 2016-07-12 NOTE — Telephone Encounter (Signed)
Pt daughter called back to follow up on what to do regarding her mom and whether she needs to come in for an appointment.

## 2016-07-12 NOTE — Telephone Encounter (Signed)
We can increase the risperdal or switch to another drug.

## 2016-07-12 NOTE — Telephone Encounter (Signed)
Any recommendations?

## 2016-07-12 NOTE — Telephone Encounter (Signed)
Pt daughter Santiago Glad called back and wanted her mom to be seen, there were no appts for today, offered 10/19 @ 11:30 but daughter had to work scheduled for 10/20 @ 3 pm. Daughter stated that she will increase the risperiDONE (RISPERDAL) 0.5 MG tablet today and tomorrow and will come in for Friday.

## 2016-07-12 NOTE — Telephone Encounter (Signed)
Pt daughter called about having issue with her mental state. Pt is keeping everyone up pt is not sleeping. Pt is sundowning, hallucinating and yelling. Daughter wants to know if something can be given so she can sleep? Please advise?  Call Santiago Glad daughter @ [phone]. Thank you!

## 2016-07-14 ENCOUNTER — Encounter: Payer: Self-pay | Admitting: Family Medicine

## 2016-07-14 ENCOUNTER — Ambulatory Visit (INDEPENDENT_AMBULATORY_CARE_PROVIDER_SITE_OTHER): Payer: Medicare Other | Admitting: Family Medicine

## 2016-07-14 DIAGNOSIS — F919 Conduct disorder, unspecified: Secondary | ICD-10-CM | POA: Diagnosis not present

## 2016-07-14 MED ORDER — QUETIAPINE FUMARATE 50 MG PO TABS
50.0000 mg | ORAL_TABLET | Freq: Every day | ORAL | 1 refills | Status: DC
Start: 2016-07-14 — End: 2016-08-02

## 2016-07-14 NOTE — Patient Instructions (Signed)
Stop the Ativan and Risperdal.  Start the Seroquel.  Follow up in 1-2 weeks.  Take care  Dr. Lacinda Axon

## 2016-07-14 NOTE — Progress Notes (Signed)
Pre visit review using our clinic review tool, if applicable. No additional management support is needed unless otherwise documented below in the visit note. 

## 2016-07-15 NOTE — Progress Notes (Signed)
   Subjective:  Patient ID: Carly Clay, female    DOB: April 26, 1927  Age: 82 y.o. MRN: [id number]  CC: Memory issues, behavior issues/disturbance  HPI:   82 year old female with SSS, Hypothyroidism, dementia presents for evaluation of the above. History provided by daughter (who is caregiver) given dementia.  Daughter reports that for the past 2 weeks she's noticed a dramatic change. Patient has had a worsening of her memory. She's not remembering things or people as she normally would. Additionally, at night (but also during the day some) she's hallucinating, moaning and whaling in her sleep. She is reporting sheets off as well as her clothing. She has also taken her ostomy off. She's been taking the Risperdal without significant improvement. Daughter has even increased the dose per my recommendations without significant improvement. No reports of fever. No shortness of breath. No reports of urinary symptoms (she has had recent UA and culture that was negative).  Social Hx   Social History   Social History  . Marital status: Divorced    Spouse name: N/A  . Number of children: N/A  . Years of education: N/A   Social History Main Topics  . Smoking status: Never Smoker  . Smokeless tobacco: Never Used  . Alcohol use No  . Drug use: No  . Sexual activity: Not Asked   Other Topics Concern  . None   Social History Narrative  . None    Review of Systems  Constitutional: Negative for fever.  Respiratory: Negative for shortness of breath.   Genitourinary: Negative.   Psychiatric/Behavioral: Positive for behavioral problems, confusion and hallucinations.    Objective:  BP 136/78 (BP Location: Right Arm, Patient Position: Sitting, Cuff Size: Normal)   Pulse 77   Temp 97.9 F (36.6 C) (Oral)   Wt 217 lb (98.4 kg) Comment: wheelchair bound  SpO2 93%   BMI 37.25 kg/m   BP/Weight 07/14/2016 06/13/2016 0000000  Systolic BP [id number] 123456 XX123456  Diastolic BP 78 76 84  Wt. (Lbs) 217  174 154  BMI 37.25 29.87 26.43    Physical Exam  Constitutional: No distress.  Cardiovascular: Normal rate and regular rhythm.   Pulmonary/Chest: Effort normal. She has no wheezes. She has no rales.  Neurological: She is alert.  Psychiatric:  Flat affect.  Vitals reviewed.  Lab Results  Component Value Date   WBC 6.7 01/20/2014   HGB 10.7 (A) 01/20/2014   HCT 33 (A) 01/20/2014   PLT 231 01/20/2014   GLUCOSE 104 (H) 03/13/2016   ALT 12 01/20/2014   AST 14 01/20/2014   NA 142 03/13/2016   K 4.2 03/13/2016   CL 105 03/13/2016   CREATININE 0.92 03/13/2016   BUN 21 03/13/2016   CO2 29 03/13/2016   INR 1.0 01/20/2014    Assessment & Plan:   Problem List Items Addressed This Visit    Behavior disturbance    Established problem, worsening. Dementia with behavioral disturbance, particularly at night. No evidence of infection.  Stopping Risperdal. Starting Seroquel.        Other Visit Diagnoses   None.     Meds ordered this encounter  Medications  . QUEtiapine (SEROQUEL) 50 MG tablet    Sig: Take 1 tablet (50 mg total) by mouth at bedtime.    Dispense:  30 tablet    Refill:  1    Follow-up: Return for 1-2 weeks.[place]

## 2016-07-15 NOTE — Assessment & Plan Note (Addendum)
Established problem, worsening. Dementia with behavioral disturbance, particularly at night. No evidence of infection.  Stopping Risperdal. Starting Seroquel.

## 2016-07-24 ENCOUNTER — Telehealth: Payer: Self-pay | Admitting: Family Medicine

## 2016-07-24 NOTE — Telephone Encounter (Signed)
Carly Clay from [facility] health called and stated that pt is still having Night terrors and hallucinations. Want to know if QUEtiapine (SEROQUEL) 50 MG tablet could be increased. Please advise, thank you!  Call [place] [phone] (may leave message [phone], if need to speak with any hit 0)

## 2016-07-24 NOTE — Telephone Encounter (Signed)
Yes. Increase to 100 mg.

## 2016-07-24 NOTE — Telephone Encounter (Signed)
Recommendations for increase?

## 2016-07-25 NOTE — Telephone Encounter (Signed)
Carly Clay was called and a voicemail was left with instructions to increase medication.

## 2016-07-31 ENCOUNTER — Ambulatory Visit: Payer: Medicare Other | Admitting: Family Medicine

## 2016-08-02 ENCOUNTER — Telehealth: Payer: Self-pay | Admitting: *Deleted

## 2016-08-02 MED ORDER — QUETIAPINE FUMARATE 50 MG PO TABS: 50.0000 mg | ORAL_TABLET | Freq: Every day | ORAL | 1 refills | Status: AC

## 2016-08-02 NOTE — Telephone Encounter (Signed)
Patient's daughter requested a refill for seroquel [facility] [place]

## 2016-08-02 NOTE — Telephone Encounter (Signed)
Refilled 07/14/16. Pt last seen 07/14/16

## 2016-08-03 ENCOUNTER — Telehealth: Payer: Self-pay | Admitting: Family Medicine

## 2016-08-03 ENCOUNTER — Other Ambulatory Visit: Payer: Self-pay

## 2016-08-03 NOTE — Telephone Encounter (Signed)
[facility] called in regards to pt's medication QUEtiapine (SEROQUEL) 50 MG tablet. Pt's daughter stated that it should have been increased to 100 mg. Pharmacy needs clarification.Thank you!  Call Pharmacy [place], [place] - [place], [address]

## 2016-08-03 NOTE — Telephone Encounter (Signed)
Pt's daughter has requested a update on this medication, she took her last dose last night.

## 2016-08-03 NOTE — Telephone Encounter (Signed)
Pharmacy was called and they stated no clarification was needed.

## 2016-08-03 NOTE — Telephone Encounter (Signed)
Daughter called back stating that the medication should be for 100mg  QUEtiapine (SEROQUEL) 50 MG tablet. Please advise?  Call daughter @ [phone]. Thank you!

## 2016-08-03 NOTE — Telephone Encounter (Signed)
Pts daughter was called and told that new rx was sent.

## 2016-08-03 NOTE — Telephone Encounter (Signed)
Daughter was called and told medication had been sent and pharmacy electronically confirmed it.

## 2016-08-07 ENCOUNTER — Encounter: Payer: Self-pay | Admitting: Family Medicine

## 2016-08-10 ENCOUNTER — Encounter: Payer: Medicare Other | Admitting: *Deleted

## 2016-08-10 ENCOUNTER — Telehealth: Payer: Self-pay | Admitting: Cardiology

## 2016-08-10 NOTE — Telephone Encounter (Signed)
Spoke w/ pt daughter. Attempted to help pt send remote transmission. They could not get cell signal. Ordered pt a mycarelink smart. Pt daughter agreed to this. They will send transmission once they get the new monitor.

## 2016-08-17 ENCOUNTER — Encounter: Payer: Self-pay | Admitting: Family Medicine

## 2016-08-29 ENCOUNTER — Ambulatory Visit (INDEPENDENT_AMBULATORY_CARE_PROVIDER_SITE_OTHER): Payer: Medicare Other | Admitting: *Deleted

## 2016-08-29 DIAGNOSIS — I495 Sick sinus syndrome: Secondary | ICD-10-CM | POA: Diagnosis not present

## 2016-08-29 NOTE — Progress Notes (Signed)
Remote pacemaker transmission.   

## 2016-08-30 ENCOUNTER — Telehealth: Payer: Self-pay | Admitting: *Deleted

## 2016-08-30 NOTE — Telephone Encounter (Signed)
[facility] was called and told information received and faxed back to them.

## 2016-08-30 NOTE — Telephone Encounter (Signed)
Denise from [facility] requested to know if the form for the certification of terminal illness was received via-fax.  Contact [facility] [phone]

## 2016-09-01 ENCOUNTER — Encounter: Payer: Self-pay | Admitting: Family Medicine

## 2016-09-01 ENCOUNTER — Telehealth: Payer: Self-pay | Admitting: Family Medicine

## 2016-09-01 DIAGNOSIS — F0390 Unspecified dementia without behavioral disturbance: Secondary | ICD-10-CM | POA: Insufficient documentation

## 2016-09-01 NOTE — Telephone Encounter (Signed)
Done

## 2016-09-01 NOTE — Telephone Encounter (Signed)
Faxed

## 2016-09-01 NOTE — Telephone Encounter (Signed)
Denise from [facility] called and wanted to know if we received a palliative care form. Pt does not fit into hospice criteria. Please advise, thank you!  Contact [facility] [phone]

## 2016-09-01 NOTE — Telephone Encounter (Signed)
Carly Clay was called and told we received form. She was told PCP has not had a chancce to sign but once signed we'd fax form back over.

## 2016-09-06 ENCOUNTER — Telehealth: Payer: Self-pay | Admitting: Family Medicine

## 2016-09-06 ENCOUNTER — Encounter: Payer: Self-pay | Admitting: Cardiology

## 2016-09-06 NOTE — Telephone Encounter (Signed)
Okay with me 

## 2016-09-06 NOTE — Telephone Encounter (Signed)
Carly Clay from [facility] called and stated that pt recently had a fall and having troubles with ambulation. They would like to know if it would be ok for her to have Physical therapy.  Please advise, thank you!  Call [phone] ext [phone]

## 2016-09-06 NOTE — Telephone Encounter (Signed)
Carly Clay was called and given verbal order for Physical Therapy.

## 2016-09-06 NOTE — Telephone Encounter (Signed)
Recommendations?

## 2016-09-08 ENCOUNTER — Telehealth: Payer: Self-pay | Admitting: *Deleted

## 2016-09-08 NOTE — Telephone Encounter (Signed)
Home care stated that family has requested Hospice services for the home. Denise from Home care questioned if it would be okay to use the same order from 12/06 for hospice.  Contact [facility] [phone]

## 2016-09-08 NOTE — Telephone Encounter (Signed)
Yes I am. 

## 2016-09-08 NOTE — Telephone Encounter (Signed)
Carly Clay was called and given okay.

## 2016-09-08 NOTE — Telephone Encounter (Signed)
Are you okay with this?

## 2016-09-14 LAB — CUP PACEART REMOTE DEVICE CHECK
Battery Remaining Longevity: 87 mo
Battery Voltage: 2.79 V
Brady Statistic AP VP Percent: 1 %
Brady Statistic AP VS Percent: 14 %
Brady Statistic AS VP Percent: 3 %
Implantable Lead Location: 753859
Implantable Lead Model: 4524
Implantable Pulse Generator Implant Date: 20130708
Lead Channel Impedance Value: 233 Ohm
Lead Channel Pacing Threshold Amplitude: 0.625 V
Lead Channel Pacing Threshold Amplitude: 1.5 V
Lead Channel Pacing Threshold Pulse Width: 0.4 ms
Lead Channel Pacing Threshold Pulse Width: 0.4 ms
Lead Channel Setting Pacing Pulse Width: 0.4 ms
MDC IDC LEAD IMPLANT DT: 19950731
MDC IDC LEAD IMPLANT DT: 20050316
MDC IDC LEAD LOCATION: 753860
MDC IDC MSMT BATTERY IMPEDANCE: 652 Ohm
MDC IDC MSMT LEADCHNL RV IMPEDANCE VALUE: 657 Ohm
MDC IDC SESS DTM: 20171205142209
MDC IDC SET LEADCHNL RA PACING AMPLITUDE: 1.5 V
MDC IDC SET LEADCHNL RV PACING AMPLITUDE: 3 V
MDC IDC SET LEADCHNL RV SENSING SENSITIVITY: 4 mV
MDC IDC STAT BRADY AS VS PERCENT: 83 %

## 2016-10-16 ENCOUNTER — Telehealth: Payer: Self-pay | Admitting: Family Medicine

## 2016-10-16 ENCOUNTER — Encounter: Payer: Self-pay | Admitting: Internal Medicine

## 2016-10-16 ENCOUNTER — Encounter: Payer: Self-pay | Admitting: Family Medicine

## 2016-10-16 NOTE — Telephone Encounter (Signed)
Provider aware and a sympathy card sent to family.

## 2016-10-16 NOTE — Telephone Encounter (Signed)
FYI - Patient's daughter Santiago Glad called and stated that patient passed away yesterday morning.  Santiago Glad @ [phone]

## 2016-10-17 ENCOUNTER — Ambulatory Visit: Payer: Medicare Other | Admitting: Family Medicine

## 2016-10-26 DEATH — deceased

## 2017-06-21 IMAGING — CR DG CHEST 2V
2 series · 2 of 2 positions shown · non-contrast
Comparison: None.

CLINICAL DATA: Wheezing and cough, 2 days duration.

EXAM:
CHEST  2 VIEW

[chest lat]
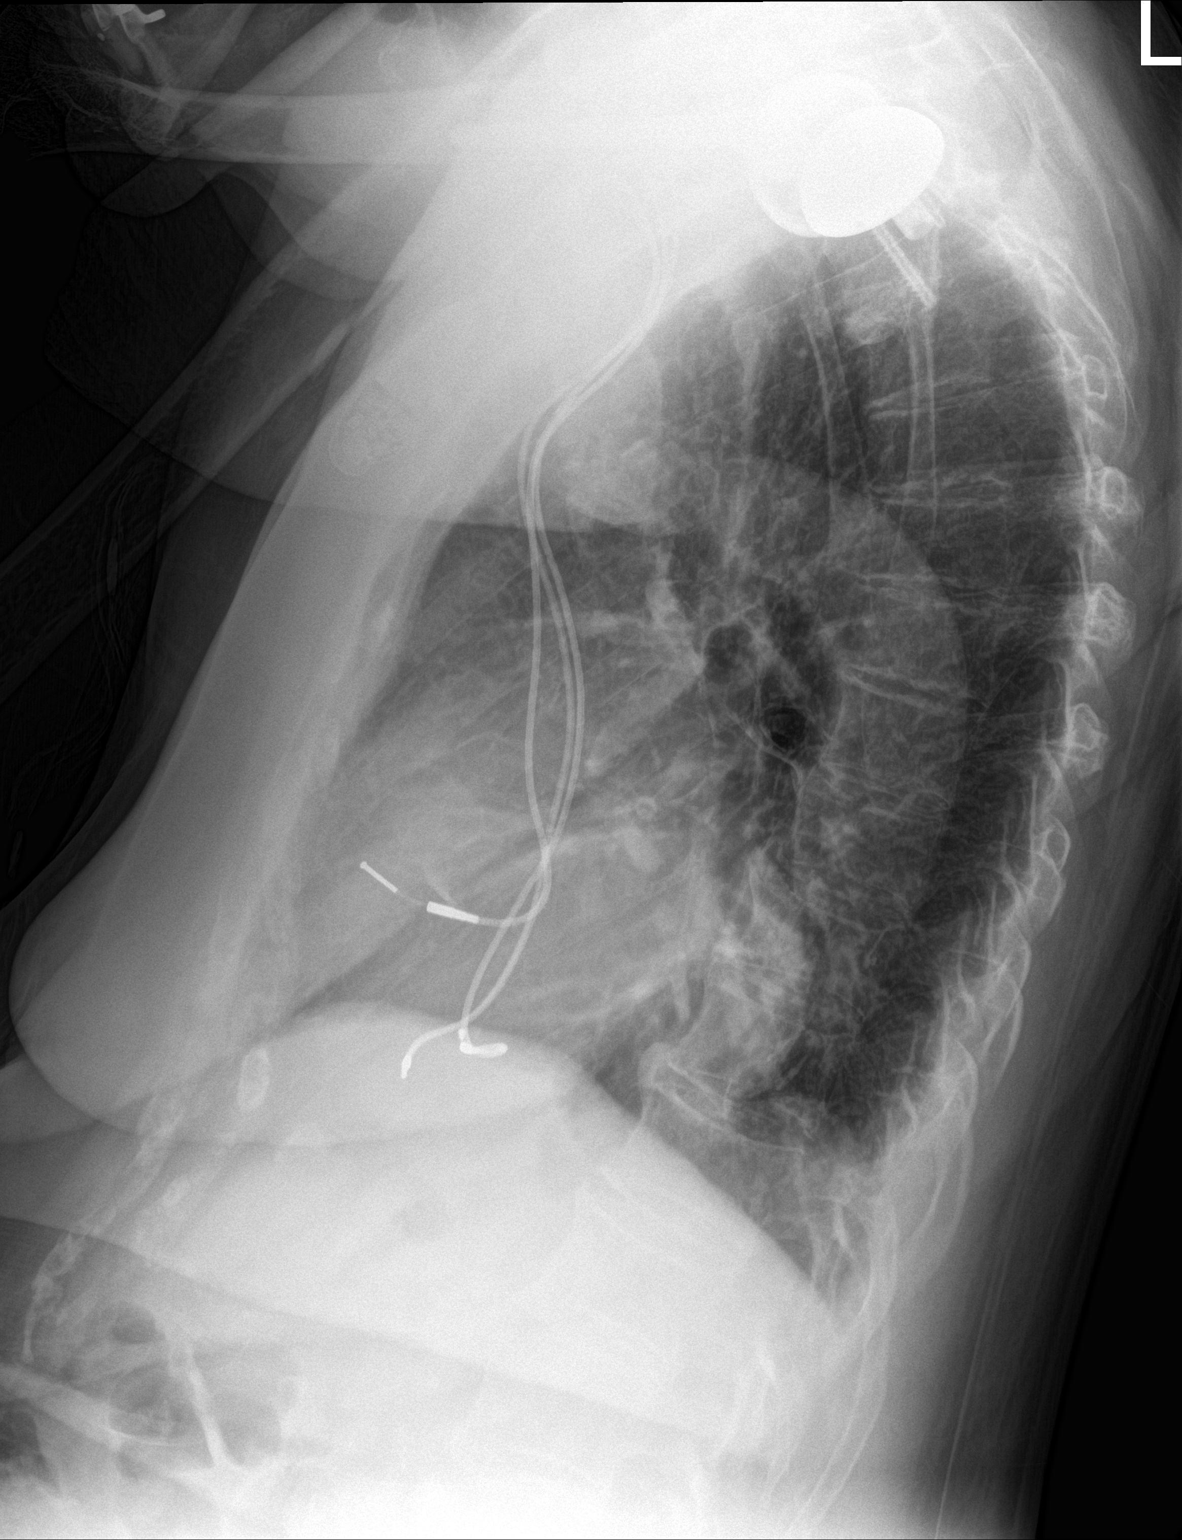

[chest ap]
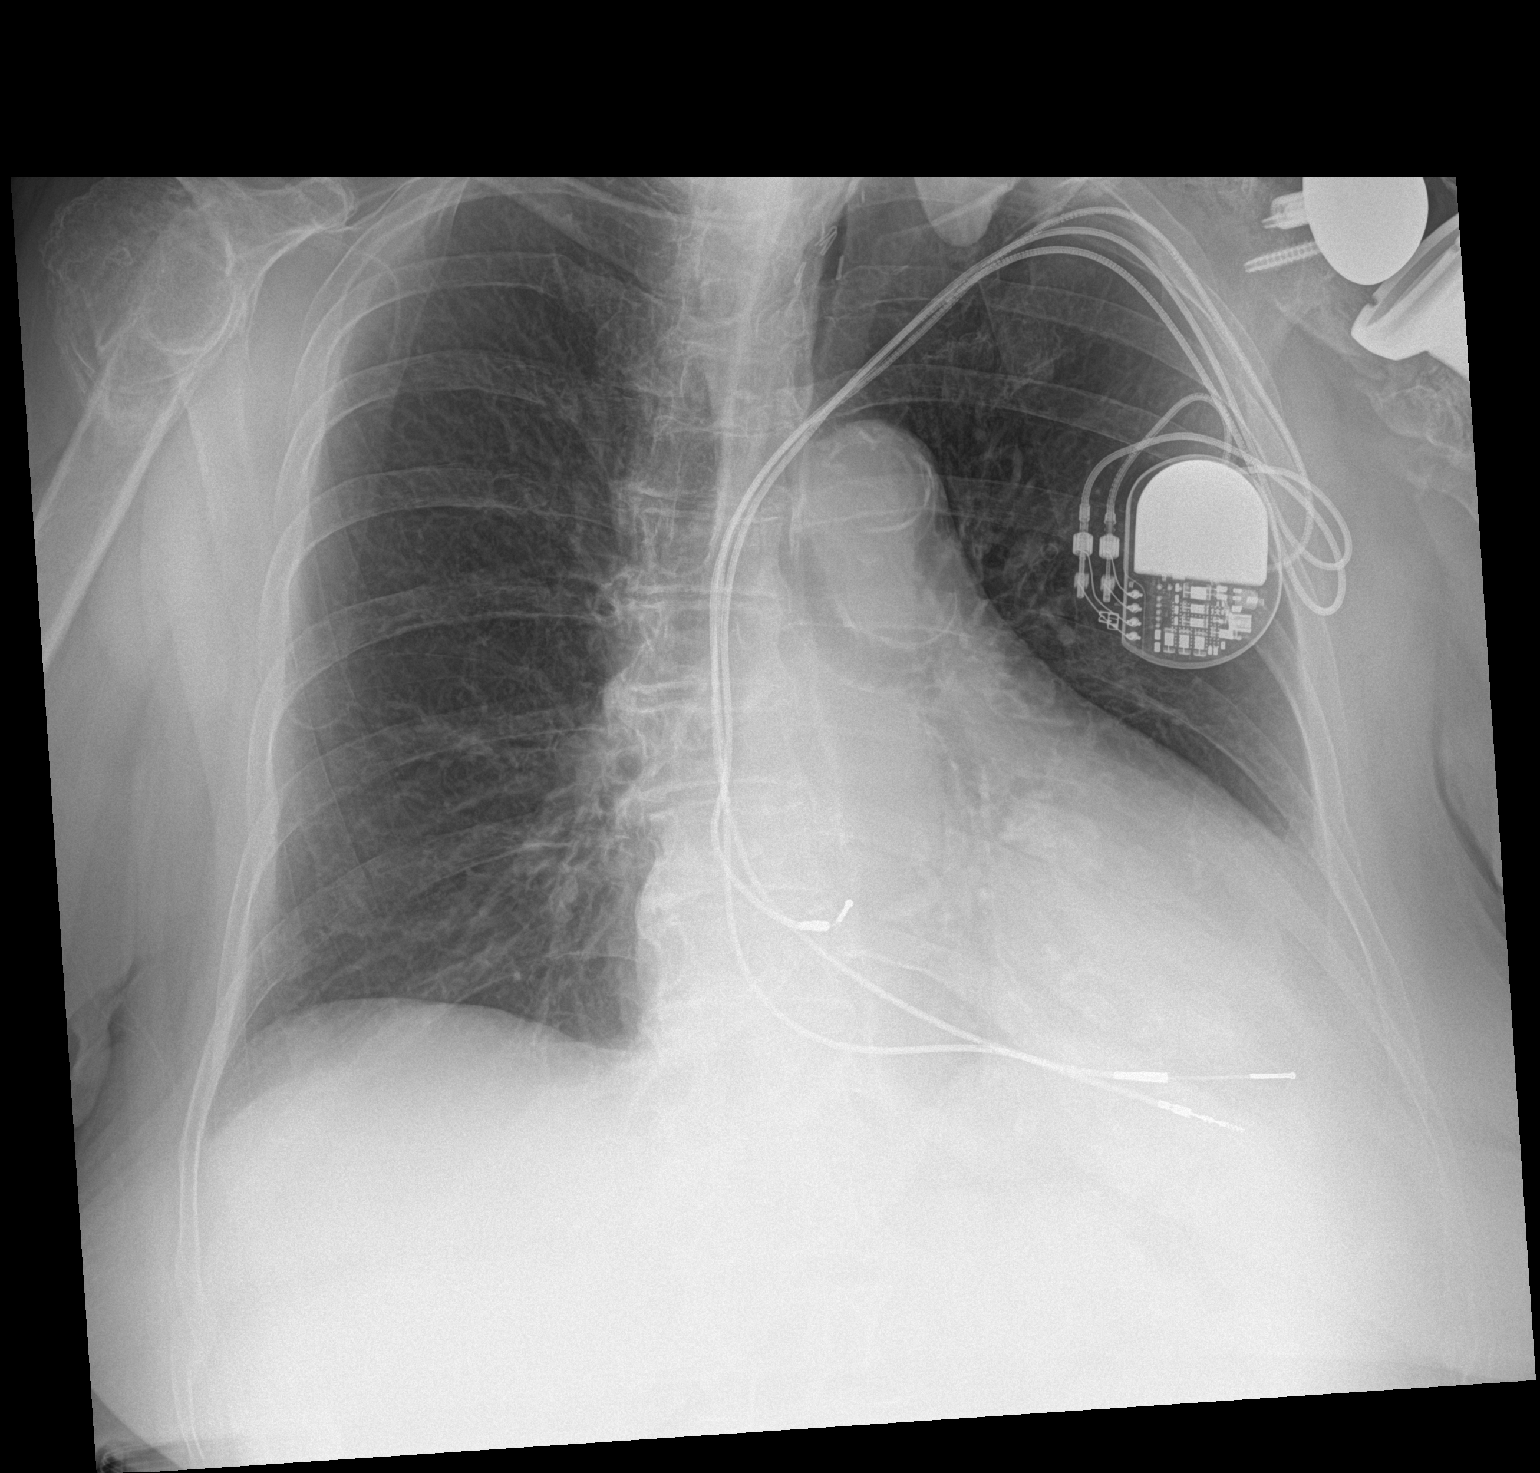

[2 of 2 positions shown; findings below may reference images not displayed]

FINDINGS: Three lead pacemaker in place, 1 lead in the region of the right
atrium into in the region of the right ventricle. The heart is
enlarged. There is aortic atherosclerosis. The right lung is clear.
There is a large hiatal hernia. There is some volume loss in the
left lower lobe. No definable pneumonia, though mild left base
pneumonia could be present. Left upper lobe is clear. No effusions.
Previous shoulder replacement on the left. Extensive post traumatic
and degenerative changes in the region of the right shoulder.
IMPRESSION: Cardiomegaly.  Aortic atherosclerosis.  Three lead pacemaker.

Hiatal hernia. Volume loss at the left base associated with that.
Left base pneumonia not excluded in this setting.
